# Patient Record
Sex: Male | Born: 1950 | Race: Black or African American | Hispanic: No | Smoking: Current some day smoker
Health system: Southern US, Community
[De-identification: ages and names within clinical notes are randomized; demographics above are authoritative.]

## PROBLEM LIST (undated history)

## (undated) DIAGNOSIS — I10 Essential (primary) hypertension: Secondary | ICD-10-CM

## (undated) DIAGNOSIS — K219 Gastro-esophageal reflux disease without esophagitis: Secondary | ICD-10-CM

## (undated) DIAGNOSIS — I82409 Acute embolism and thrombosis of unspecified deep veins of unspecified lower extremity: Secondary | ICD-10-CM

## (undated) HISTORY — DX: Acute embolism and thrombosis of unspecified deep veins of unspecified lower extremity: I82.409

## (undated) HISTORY — PX: ANKLE SURGERY: SHX546

## (undated) HISTORY — PX: PILONIDAL CYST EXCISION: SHX744

## (undated) HISTORY — PX: ESOPHAGEAL DILATION: SHX303

## (undated) HISTORY — DX: Essential (primary) hypertension: I10

---

## 2008-04-23 ENCOUNTER — Emergency Department (HOSPITAL_COMMUNITY): Admission: EM | Admit: 2008-04-23 | Discharge: 2008-04-24 | Payer: Self-pay | Admitting: Emergency Medicine

## 2008-04-27 ENCOUNTER — Ambulatory Visit (HOSPITAL_COMMUNITY): Admission: RE | Admit: 2008-04-27 | Discharge: 2008-04-27 | Payer: Self-pay | Admitting: Orthopedic Surgery

## 2011-02-05 NOTE — Op Note (Signed)
Kenneth Sims, Kenneth Sims           ACCOUNT NO.:  [id number]   MEDICAL RECORD NO.:  [id number]          PATIENT TYPE:  AMB   LOCATION:  SDS                          FACILITY:  [facility]   PHYSICIAN:  Vania Rea. Supple, M.D.  DATE OF BIRTH:  Aug 08, 1951   DATE OF PROCEDURE:  04/27/2008  DATE OF DISCHARGE:  04/27/2008                               OPERATIVE REPORT   PREOPERATIVE DIAGNOSIS:  Displaced left distal fibular fracture.   POSTOPERATIVE DIAGNOSIS:  Displaced left distal fibular fracture.   PROCEDURE:  Open reduction and internal fixation of displaced left  distal fibular fracture.   SURGEON:  Vania Rea. Supple, MD   ASSISTANT:  Lucita Lora. Shuford, PA-C   ANESTHESIA:  LMA general.   TOURNIQUET TIME:  Less than one hour.   ESTIMATED BLOOD LOSS:  Minimal.   DRAINS:  None.   HISTORY:  Mr. Kenneth Sims is a 60 year old gentleman who sustained a left  ankle fracture-dislocation this past weekend and underwent a successful  closed reduction and splinting in the emergency room.  On followup in  our office, we reviewed his radiographs.  He was in a well-padded splint  and was grossly neurovascularly intact.  Due to the degree of  displacement, we have discussed open reduction and internal fixation.  Possible surgical complication, bleeding, infection, neurovascular  injury, DVT, PE, malunion, nonunion, loss of fixation, and possible need  for additional surgery are reviewed.  He understands and accepts and  agrees with our planned procedure.   PROCEDURE IN DETAIL:  After undergoing routine preoperative evaluation,  the patient received prophylactic antibiotics.  Placed supine on the  operating table and underwent smooth induction of an LMA general  anesthesia.  Tourniquet applied to the left thigh.  Left leg was  sterilely prepped and draped in the standard fashion.  We did find two  fracture blisters medially.  These were protected.  Leg was  exsanguinated with the tourniquet inflated  to 350 mmHg.  We made a  longitudinal 12-cm incision over the lateral aspect of the leg distally  overlying the distal fibula.  Skin flaps were elevated anteriorly and  posteriorly and dissection carried deeply down to the lateral margin of  the fibula, and a subperiosteal dissection was used to expose the fibula  surrounding the fracture site as well as his posterolateral margin.  The  fracture site was exposed, irrigated, and interposed soft tissue and  hematoma was meticulously removed.  Under direct visualization, the  fibula was reduced.  We contoured a 10-hole locking one-third tubular  plate fitted on the posterolateral margin of the distal fibula and this  was then transfixed initially with a lag screw passing through the plate  and across the fracture site using standard technique and we placed  three locking screws distally.  A second lag screw obliquely across the  fracture site and then additional locking screws proximally as well as a  single cortical screw, which we placed primarily.  Good bony fixation  was achieved.  Intraoperative fluoroscopic images were then obtained,  which showed good alignment of the fracture site and good position of  the  hardware.  The wound was then copiously irrigated.  It was closed in  layers with 0 Vicryl for the deep fascia, 2-0 Vicryl for the subcu, and  intracuticular 3-0 Monocryl for the skin followed by Steri-Strips.  A  bulky dry dressing was then placed over the incision.  Once the closure  was completed, we did instill 0.5% Marcaine liberally along the skin  edges of the incision.  We then applied a very well-padded short-leg  plaster splint with the ankle in neutral position.  The tourniquet was  then let down.  The patient was extubated and taken to the recovery room  in stable condition.      Vania Rea. Supple, M.D.  Electronically Signed     KMS/MEDQ  D:  04/27/2008  T:  04/28/2008  Job:  [id number]

## 2011-05-31 ENCOUNTER — Emergency Department (HOSPITAL_COMMUNITY)
Admission: EM | Admit: 2011-05-31 | Discharge: 2011-05-31 | Disposition: A | Discharge status: Home / Self Care | Payer: 59 | Attending: Emergency Medicine | Admitting: Emergency Medicine

## 2011-05-31 DIAGNOSIS — Z79899 Other long term (current) drug therapy: Secondary | ICD-10-CM | POA: Insufficient documentation

## 2011-05-31 DIAGNOSIS — M79609 Pain in unspecified limb: Secondary | ICD-10-CM | POA: Insufficient documentation

## 2011-05-31 DIAGNOSIS — M7989 Other specified soft tissue disorders: Secondary | ICD-10-CM | POA: Insufficient documentation

## 2011-05-31 DIAGNOSIS — Z86718 Personal history of other venous thrombosis and embolism: Secondary | ICD-10-CM | POA: Insufficient documentation

## 2011-05-31 DIAGNOSIS — E785 Hyperlipidemia, unspecified: Secondary | ICD-10-CM | POA: Insufficient documentation

## 2011-05-31 DIAGNOSIS — I824Z9 Acute embolism and thrombosis of unspecified deep veins of unspecified distal lower extremity: Secondary | ICD-10-CM | POA: Insufficient documentation

## 2011-05-31 LAB — POCT I-STAT, CHEM 8
BUN: 20 mg/dL (ref 6–23)
Calcium, Ion: 1.18 mmol/L (ref 1.12–1.32)
Chloride: 98 mEq/L (ref 96–112)
Creatinine, Ser: 1.5 mg/dL — ABNORMAL HIGH (ref 0.50–1.35)

## 2011-05-31 LAB — PROTIME-INR
INR: 1.03 (ref 0.00–1.49)
Prothrombin Time: 13.7 seconds (ref 11.6–15.2)

## 2011-06-21 LAB — CBC
MCV: 91.7
RBC: 4.89
WBC: 7.1

## 2011-06-21 LAB — URINALYSIS, ROUTINE W REFLEX MICROSCOPIC
Glucose, UA: NEGATIVE
Ketones, ur: NEGATIVE
pH: 5.5

## 2011-06-21 LAB — COMPREHENSIVE METABOLIC PANEL
ALT: 23
AST: 40 — ABNORMAL HIGH
CO2: 27
Chloride: 102
GFR calc Af Amer: >60
GFR calc non Af Amer: >60
Sodium: 135
Total Bilirubin: 0.6

## 2011-07-04 ENCOUNTER — Other Ambulatory Visit: Payer: Self-pay | Admitting: Internal Medicine

## 2011-07-10 ENCOUNTER — Ambulatory Visit
Admission: RE | Admit: 2011-07-10 | Discharge: 2011-07-10 | Disposition: A | Discharge status: Home / Self Care | Payer: 59 | Source: Ambulatory Visit | Attending: Internal Medicine | Admitting: Internal Medicine

## 2013-07-02 ENCOUNTER — Ambulatory Visit (INDEPENDENT_AMBULATORY_CARE_PROVIDER_SITE_OTHER): Payer: 59

## 2013-07-02 DIAGNOSIS — Z23 Encounter for immunization: Secondary | ICD-10-CM

## 2017-01-17 ENCOUNTER — Encounter (HOSPITAL_COMMUNITY): Payer: Self-pay | Admitting: Emergency Medicine

## 2017-01-17 ENCOUNTER — Emergency Department (HOSPITAL_COMMUNITY)
Admission: EM | Admit: 2017-01-17 | Discharge: 2017-01-17 | Disposition: A | Discharge status: Home / Self Care | Payer: 59 | Attending: Emergency Medicine | Admitting: Emergency Medicine

## 2017-01-17 DIAGNOSIS — F17200 Nicotine dependence, unspecified, uncomplicated: Secondary | ICD-10-CM | POA: Insufficient documentation

## 2017-01-17 DIAGNOSIS — R1013 Epigastric pain: Secondary | ICD-10-CM | POA: Diagnosis present

## 2017-01-17 HISTORY — DX: Gastro-esophageal reflux disease without esophagitis: K21.9

## 2017-01-17 LAB — URINALYSIS, ROUTINE W REFLEX MICROSCOPIC
Bacteria, UA: NONE SEEN
Bilirubin Urine: NEGATIVE
GLUCOSE, UA: NEGATIVE mg/dL
Ketones, ur: NEGATIVE mg/dL
Leukocytes, UA: NEGATIVE
NITRITE: NEGATIVE
Protein, ur: NEGATIVE mg/dL
SPECIFIC GRAVITY, URINE: 1.023 (ref 1.005–1.030)
Squamous Epithelial / LPF: NONE SEEN
pH: 5 (ref 5.0–8.0)

## 2017-01-17 LAB — CBC
HCT: 44.4 % (ref 39.0–52.0)
HEMOGLOBIN: 15.3 g/dL (ref 13.0–17.0)
MCH: 30.4 pg (ref 26.0–34.0)
MCHC: 34.5 g/dL (ref 30.0–36.0)
MCV: 88.1 fL (ref 78.0–100.0)
Platelets: 255 10*3/uL (ref 150–400)
RBC: 5.04 MIL/uL (ref 4.22–5.81)
RDW: 13.3 % (ref 11.5–15.5)
WBC: 6.1 10*3/uL (ref 4.0–10.5)

## 2017-01-17 LAB — COMPREHENSIVE METABOLIC PANEL
ALBUMIN: 3.9 g/dL (ref 3.5–5.0)
ALK PHOS: 43 U/L (ref 38–126)
ALT: 17 U/L (ref 17–63)
ANION GAP: 7 (ref 5–15)
AST: 24 U/L (ref 15–41)
BILIRUBIN TOTAL: 0.6 mg/dL (ref 0.3–1.2)
BUN: 12 mg/dL (ref 6–20)
CALCIUM: 9.4 mg/dL (ref 8.9–10.3)
CO2: 26 mmol/L (ref 22–32)
Chloride: 103 mmol/L (ref 101–111)
Creatinine, Ser: 1.21 mg/dL (ref 0.61–1.24)
GFR calc Af Amer: >60 mL/min (ref >60)
GLUCOSE: 103 mg/dL — AB (ref 65–99)
Potassium: 3.9 mmol/L (ref 3.5–5.1)
Sodium: 136 mmol/L (ref 135–145)
TOTAL PROTEIN: 7.8 g/dL (ref 6.5–8.1)

## 2017-01-17 LAB — LIPASE, BLOOD: Lipase: 64 U/L — ABNORMAL HIGH (ref 11–51)

## 2017-01-17 MED ORDER — GI COCKTAIL ~~LOC~~
30.0000 mL | Freq: Once | ORAL | Status: AC
  Administered 2017-01-17: 30 mL via ORAL
  Filled 2017-01-17: qty 30

## 2017-01-17 MED ORDER — SUCRALFATE 1 G PO TABS
1.0000 g | ORAL_TABLET | Freq: Once | ORAL | Status: AC
  Administered 2017-01-17: 1 g via ORAL
  Filled 2017-01-17: qty 1

## 2017-01-17 MED ORDER — PANTOPRAZOLE SODIUM 20 MG PO TBEC: 20.0000 mg | DELAYED_RELEASE_TABLET | Freq: Every day | ORAL | 0 refills | Status: DC

## 2017-01-17 MED ORDER — SUCRALFATE 1 G PO TABS: 1.0000 g | ORAL_TABLET | Freq: Four times a day (QID) | ORAL | 0 refills | Status: DC

## 2017-01-17 NOTE — ED Triage Notes (Signed)
Pt c/o mid and upper abdominal pain and indigestion x 2 weeks. Pt states he has had hx of GERD and esophageal procedure and takes Zantac without relief. Pt with decreased appetite, c/o bloating and reports diarrhea and loose stool x 2 weeks.

## 2017-01-17 NOTE — ED Notes (Signed)
PT DISCHARGED. INSTRUCTIONS AND PRESCRIPTIONS GIVEN. AAOX4. PT IN NO APPARENT DISTRESS OR PAIN. THE OPPORTUNITY TO ASK QUESTIONS WAS PROVIDED. 

## 2017-01-17 NOTE — ED Provider Notes (Signed)
[facility]-EMERGENCY DEPT Provider Note   CSN: [id number] Arrival date & time: 01/17/17  1114     History   Chief Complaint Chief Complaint  Patient presents with  . Abdominal Pain    HPI Kenneth Sims is a 66 y.o. male.  66 year old male presents with epigastric discomfort rating to his chest 2 weeks. No associated anginal type symptoms such as shortness of breath or diaphoresis. He also has a history of esophageal strictures but has not been seen for this recently. Does note increased belching. Symptoms are improved with taking Zantac. No associated vomiting or diarrhea. Symptoms are somewhat worse with food. No exertional component to them.       Past Medical History:  Diagnosis Date  . GERD (gastroesophageal reflux disease)     There are no active problems to display for this patient.   Past Surgical History:  Procedure Laterality Date  . ESOPHAGEAL DILATION         Home Medications    Prior to Admission medications   Not on File    Family History History reviewed. No pertinent family history.  Social History Social History  Substance Use Topics  . Smoking status: Current Some Day Smoker  . Smokeless tobacco: Never Used  . Alcohol use No     Comment: weekend     Allergies   Patient has no allergy information on record.   Review of Systems Review of Systems  All other systems reviewed and are negative.    Physical Exam Updated Vital Signs BP 127/85 (BP Location: Left Arm)   Pulse 78   Temp 97.8 F (36.6 C) (Oral)   Resp 16   SpO2 100%   Physical Exam  Constitutional: He is oriented to person, place, and time. He appears well-developed and well-nourished.  Non-toxic appearance. No distress.  HENT:  Head: Normocephalic and atraumatic.  Eyes: Conjunctivae, EOM and lids are normal. Pupils are equal, round, and reactive to light.  Neck: Normal range of motion. Neck supple. No tracheal deviation present. No thyroid mass present.    Cardiovascular: Normal rate, regular rhythm and normal heart sounds.  Exam reveals no gallop.   No murmur heard. Pulmonary/Chest: Effort normal and breath sounds normal. No stridor. No respiratory distress. He has no decreased breath sounds. He has no wheezes. He has no rhonchi. He has no rales.  Abdominal: Soft. Normal appearance and bowel sounds are normal. He exhibits no distension. There is no tenderness. There is no rebound and no CVA tenderness.  Musculoskeletal: Normal range of motion. He exhibits no edema or tenderness.  Neurological: He is alert and oriented to person, place, and time. He has normal strength. No cranial nerve deficit or sensory deficit. GCS eye subscore is 4. GCS verbal subscore is 5. GCS motor subscore is 6.  Skin: Skin is warm and dry. No abrasion and no rash noted.  Psychiatric: He has a normal mood and affect. His speech is normal and behavior is normal.  Nursing note and vitals reviewed.    ED Treatments / Results  Labs (all labs ordered are listed, but only abnormal results are displayed) Labs Reviewed  CBC  LIPASE, BLOOD  COMPREHENSIVE METABOLIC PANEL  URINALYSIS, ROUTINE W REFLEX MICROSCOPIC    EKG  EKG Interpretation None       Radiology No results found.  Procedures Procedures (including critical care time)  Medications Ordered in ED Medications  sucralfate (CARAFATE) tablet 1 g (not administered)  gi cocktail (Maalox,Lidocaine,Donnatal) (not administered)  Initial Impression / Assessment and Plan / ED Course  I have reviewed the triage vital signs and the nursing notes.  Pertinent labs & imaging results that were available during my care of the patient were reviewed by me and considered in my medical decision making (see chart for details).     Patient treated for acid reflux and feels better. He also has history of esophageal strictures nothing that that plays a component to this. Will follow-up with his primary care doctor  in a week and will prescribe patient a PPI as well as Carafate  Final Clinical Impressions(s) / ED Diagnoses   Final diagnoses:  None    New Prescriptions New Prescriptions   No medications on file     Lorre Nick, MD 01/17/17 1348

## 2017-02-26 ENCOUNTER — Ambulatory Visit: Payer: 59 | Admitting: Gastroenterology

## 2017-03-14 ENCOUNTER — Ambulatory Visit: Payer: 59 | Admitting: Gastroenterology

## 2017-04-24 ENCOUNTER — Encounter: Payer: Self-pay | Admitting: Gastroenterology

## 2017-05-02 ENCOUNTER — Ambulatory Visit: Payer: 59 | Admitting: Gastroenterology

## 2017-06-27 ENCOUNTER — Ambulatory Visit: Payer: 59 | Admitting: Gastroenterology

## 2017-08-01 ENCOUNTER — Ambulatory Visit: Payer: 59 | Admitting: Gastroenterology

## 2017-09-09 ENCOUNTER — Encounter: Payer: Self-pay | Admitting: Gastroenterology

## 2017-10-31 ENCOUNTER — Telehealth: Payer: Self-pay

## 2017-10-31 ENCOUNTER — Ambulatory Visit: Payer: Managed Care, Other (non HMO) | Admitting: Gastroenterology

## 2017-10-31 ENCOUNTER — Encounter (INDEPENDENT_AMBULATORY_CARE_PROVIDER_SITE_OTHER): Payer: Self-pay

## 2017-10-31 ENCOUNTER — Encounter: Payer: Self-pay | Admitting: Gastroenterology

## 2017-10-31 VITALS — BP 118/66 | HR 88 | Ht 75.0 in | Wt 256.0 lb

## 2017-10-31 DIAGNOSIS — R1310 Dysphagia, unspecified: Secondary | ICD-10-CM | POA: Diagnosis not present

## 2017-10-31 NOTE — Telephone Encounter (Signed)
EGD scheduled for 12-02-2017. Xarelto clearance letter faxed to Dr Tenna DelaineBadgers office. Will await response.

## 2017-10-31 NOTE — Patient Instructions (Addendum)
If you are age 68 or older, your body mass index should be between 23-30. Your Body mass index is 32 kg/m. If this is out of the aforementioned range listed, please consider follow up with your Primary Care Provider.  If you are age 68 or younger, your body mass index should be between 19-25. Your Body mass index is 32 kg/m. If this is out of the aformentioned range listed, please consider follow up with your Primary Care Provider.   You have been scheduled for an endoscopy. Please follow written instructions given to you at your visit today. If you use inhalers (even only as needed), please bring them with you on the day of your procedure. Your physician has requested that you go to www.startemmi.com and enter the access code given to you at your visit today. This web site gives a general overview about your procedure. However, you should still follow specific instructions given to you by our office regarding your preparation for the procedure.  You will be contaced by our office prior to your procedure for directions on holding your xarelto.  If you do not hear from our office 1 week prior to your scheduled procedure, please call [phone] to discuss.  Thank you for choosing [facility]  Dr Amada JupiterHenry Danis III

## 2017-10-31 NOTE — Progress Notes (Signed)
[facility] Consult Note:  History: Kenneth Sims 10/31/2017  Referring physician: Eartha Inch, MD  Reason for consult/chief complaint: Dysphagia (has been occuring for years; typically to solid foods but occasionally to liquids as well;  ); Emesis (happens episodically); and Gastroesophageal Reflux (has reflux as well as epigastric abdominal discomfort at times)   Subjective  HPI:  This is a 67 year old man referred by primary care noted above for chronic dysphagia.  He reports that it has been occurring for decades, and he believes he had an upper endoscopy with a dilation decades ago.  He will often feel that solid food or liquids feel "heavy" or stuck in the upper abdomen.  It does not seem to feel stuck in the chest.  Undigested food will often come back up,  it sounds more likely regurgitation and then vomiting.  He denies odynophagia, altered bowel habits or rectal bleeding.  His appetite is good and weight stable.  He reports a normal colonoscopy with the last few years, perhaps done in [place].  He also had an emergency department visit at [facility] last April for GERD.  Since he only occasionally gets heartburn, he takes Zantac as needed.  ROS:  Review of Systems  Constitutional: Negative for appetite change and unexpected weight change.  HENT: Negative for mouth sores and voice change.   Eyes: Negative for pain and redness.  Respiratory: Negative for cough and shortness of breath.   Cardiovascular: Negative for chest pain and palpitations.  Genitourinary: Negative for dysuria and hematuria.  Musculoskeletal: Negative for arthralgias and myalgias.  Skin: Negative for pallor and rash.  Neurological: Negative for weakness and headaches.  Hematological: Negative for adenopathy.     Past Medical History: Past Medical History:  Diagnosis Date  . DVT (deep venous thrombosis) (HCC)   . GERD (gastroesophageal reflux disease)   .  Hypertension    DVT was many years ago, but he is maintained on Xarelto by Dr. Cyndia Bent.  Past Surgical History: Past Surgical History:  Procedure Laterality Date  . ANKLE SURGERY Left   . ESOPHAGEAL DILATION    . PILONIDAL CYST EXCISION     at rectum     Family History: Family History  Problem Relation Age of Onset  . Esophageal cancer Neg Hx   . Colon cancer Neg Hx   . Pancreatic cancer Neg Hx   . Stomach cancer Neg Hx   . Liver disease Neg Hx     Social History: Social History   Socioeconomic History  . Marital status: Widowed    Spouse name: None  . Number of children: None  . Years of education: None  . Highest education level: None  Social Needs  . Financial resource strain: None  . Food insecurity - worry: None  . Food insecurity - inability: None  . Transportation needs - medical: None  . Transportation needs - non-medical: None  Occupational History  . None  Tobacco Use  . Smoking status: Current Some Day Smoker  . Smokeless tobacco: Never Used  Substance and Sexual Activity  . Alcohol use: No    Comment: weekend  . Drug use: Yes    Types: Marijuana  . Sexual activity: None  Other Topics Concern  . None  Social History Narrative  . None  Occasionally smokes cigars and marijuana  Allergies: No Known Allergies  Outpatient Meds: Current Outpatient Medications  Medication Sig Dispense Refill  . atorvastatin (LIPITOR) 40 MG tablet Take 40 mg by  mouth daily.    Marland Kitchen. lisinopril (PRINIVIL,ZESTRIL) 40 MG tablet Take 40 mg by mouth daily.    . pantoprazole (PROTONIX) 20 MG tablet Take 1 tablet (20 mg total) by mouth daily. (Patient taking differently: Take 20 mg by mouth daily as needed. ) 60 tablet 0  . rivaroxaban (XARELTO) 20 MG TABS tablet Take 20 mg by mouth daily.    . sucralfate (CARAFATE) 1 g tablet Take 1 tablet (1 g total) by mouth 4 (four) times daily. 30 tablet 0   No current facility-administered medications for this visit.        ___________________________________________________________________ Objective   Exam:  BP 118/66   Pulse 88   Ht 6\' 3"  (1.905 m)   Wt 256 lb (116.1 kg)   BMI 32.00 kg/m    General: this is a(n) well-appearing man, pleasant but poor eye contact  Eyes: sclera anicteric, no redness  ENT: oral mucosa moist without lesions, no cervical or supraclavicular lymphadenopathy, good dentition  CV: RRR without murmur, S1/S2, no JVD, no peripheral edema  Resp: clear to auscultation bilaterally, normal RR and effort noted  GI: soft, no tenderness, with active bowel sounds. No guarding or palpable organomegaly noted.  Skin; warm and dry, no rash or jaundice noted  Neuro: awake, alert and oriented x 3. Normal gross motor function and fluent speech  No data for review  Assessment: Encounter Diagnosis  Name Primary?  Marland Kitchen. Dysphagia, unspecified type Yes    It is difficult to tell for certain if this is true dysphagia or severe regurgitation.  Consider esophageal stricture, achalasia, gastric outlet obstruction.  It occurs intermittently, and has been going on for decades, previous endoscopic findings are known.  Plan:  EGD with possible dilation.  He is agreeable after thorough discussion of procedure and risks.  He needs to be off Xarelto 2 days prior, he understands the risk of DVT/PE while he is off the medicine, which would be 2 days prior and perhaps up to a day afterwards of a dilation is performed.  He says this will probably not be an issue since sometimes he does not take the medicine for a week at a time.  Nevertheless, we will clear it with his primary care doctor who prescribed this medicine.   The benefits and risks of the planned procedure were described in detail with the patient or (when appropriate) their health care proxy.  Risks were outlined as including, but not limited to, bleeding, infection, perforation, adverse medication reaction leading to cardiac or  pulmonary decompensation, or pancreatitis (if ERCP).  The limitation of incomplete mucosal visualization was also discussed.  No guarantees or warranties were given.   Thank you for the courtesy of this consult.  Please call me with any questions or concerns.  Kenneth Sims  CC: Eartha InchBadger, Michael C, MD

## 2017-11-12 NOTE — Telephone Encounter (Signed)
2nd request faxed.

## 2017-11-17 NOTE — Telephone Encounter (Signed)
Left a message with Sabrina at Dr. Tenna DelaineBadgers office. She will have the MA return my call Re: holding Xarelto

## 2017-11-19 NOTE — Telephone Encounter (Signed)
See scanned letter returned form Dr Tenna DelaineBadgers office. Okay to hold Xarelto 12-01-17 and 12-02-2017 (48 hours prior to procedure).

## 2017-11-19 NOTE — Telephone Encounter (Signed)
Left message for pt to return call.

## 2017-11-24 NOTE — Telephone Encounter (Signed)
Left a message to return call.  

## 2017-12-02 ENCOUNTER — Encounter: Payer: Self-pay | Admitting: Gastroenterology

## 2017-12-02 ENCOUNTER — Telehealth: Payer: Self-pay | Admitting: Gastroenterology

## 2017-12-02 ENCOUNTER — Other Ambulatory Visit (HOSPITAL_COMMUNITY)
Admission: RE | Admit: 2017-12-02 | Discharge: 2017-12-02 | Disposition: A | Discharge status: Home / Self Care | Payer: Managed Care, Other (non HMO) | Source: Ambulatory Visit | Attending: Gastroenterology | Admitting: Gastroenterology

## 2017-12-02 ENCOUNTER — Other Ambulatory Visit: Payer: Self-pay

## 2017-12-02 ENCOUNTER — Ambulatory Visit (AMBULATORY_SURGERY_CENTER): Payer: Managed Care, Other (non HMO) | Admitting: Gastroenterology

## 2017-12-02 VITALS — BP 130/83 | HR 58 | Temp 98.2°F | Resp 14 | Ht 75.0 in | Wt 256.0 lb

## 2017-12-02 DIAGNOSIS — R1110 Vomiting, unspecified: Secondary | ICD-10-CM

## 2017-12-02 DIAGNOSIS — R1310 Dysphagia, unspecified: Secondary | ICD-10-CM

## 2017-12-02 MED ORDER — SODIUM CHLORIDE 0.9 % IV SOLN: 500.0000 mL | Freq: Once | INTRAVENOUS | Status: DC

## 2017-12-02 NOTE — Progress Notes (Signed)
Pt's states no medical or surgical changes since previsit or office visit. 

## 2017-12-02 NOTE — Telephone Encounter (Signed)
Please schedule the following tests for this patient:  Barium swallow  Esophageal manometry (does not need to hold Xarelto prior);  Indication:  Dysphagia, suspected achalasia  Also give him an appointment 2-3 weeks after the manometry is done to allow time for it to be read.  Mr. Kenneth Sims is expecting to hear from you.

## 2017-12-02 NOTE — Progress Notes (Signed)
Called to room to assist during endoscopic procedure.  Patient ID and intended procedure confirmed with present staff. Received instructions for my participation in the procedure from the performing physician.  

## 2017-12-02 NOTE — Patient Instructions (Signed)
RESUME XARELTO today.  YOU HAD AN ENDOSCOPIC PROCEDURE TODAY AT THE [facility]:   Refer to the procedure report that was given to you for any specific questions about what was found during the examination.  If the procedure report does not answer your questions, please call your gastroenterologist to clarify.  If you requested that your care partner not be given the details of your procedure findings, then the procedure report has been included in a sealed envelope for you to review at your convenience later.  YOU SHOULD EXPECT: Some feelings of bloating in the abdomen. Passage of more gas than usual.  Walking can help get rid of the air that was put into your GI tract during the procedure and reduce the bloating. If you had a lower endoscopy (such as a colonoscopy or flexible sigmoidoscopy) you may notice spotting of blood in your stool or on the toilet paper. If you underwent a bowel prep for your procedure, you may not have a normal bowel movement for a few days.  Please Note:  You might notice some irritation and congestion in your nose or some drainage.  This is from the oxygen used during your procedure.  There is no need for concern and it should clear up in a day or so.  SYMPTOMS TO REPORT IMMEDIATELY:    Following upper endoscopy (EGD)  Vomiting of blood or coffee ground material  New chest pain or pain under the shoulder blades  Painful or persistently difficult swallowing  New shortness of breath  Fever of 100F or higher  Black, tarry-looking stools  For urgent or emergent issues, a gastroenterologist can be reached at any hour by calling [phone].   DIET:  We do recommend a small meal at first, but then you may proceed to your regular diet.  Drink plenty of fluids but you should avoid alcoholic beverages for 24 hours.  ACTIVITY:  You should plan to take it easy for the rest of today and you should NOT DRIVE or use heavy machinery until tomorrow (because of  the sedation medicines used during the test).    FOLLOW UP: Our staff will call the number listed on your records the next business day following your procedure to check on you and address any questions or concerns that you may have regarding the information given to you following your procedure. If we do not reach you, we will leave a message.  However, if you are feeling well and you are not experiencing any problems, there is no need to return our call.  We will assume that you have returned to your regular daily activities without incident.  If any biopsies were taken you will be contacted by phone or by letter within the next 1-3 weeks.  Please call us at [phone] if you have not heard about the biopsies in 3 weeks.    SIGNATURES/CONFIDENTIALITY: You and/or your care partner have signed paperwork which will be entered into your electronic medical record.  These signatures attest to the fact that that the information above on your After Visit Summary has been reviewed and is understood.  Full responsibility of the confidentiality of this discharge information lies with you and/or your care-partner.

## 2017-12-02 NOTE — Progress Notes (Signed)
[facility]brushing[facility] and handed it to BJ'sDebbie Gibson, lab employee.

## 2017-12-02 NOTE — Op Note (Signed)
[facility] Patient Name: Kenneth RochesterKenneth Sims Procedure Date: 12/02/2017 10:31 AM MRN: [id number] Endoscopist: Sherilyn CooterHenry L. Myrtie Neitheranis , MD Age: 67 Referring MD:  Date of Birth: 05/14/1951 Gender: Male Account #: [id number] Procedure:                Upper GI endoscopy Indications:              Dysphagia, Suspected esophageal reflux, Vomiting Medicines:                Monitored Anesthesia Care Procedure:                Pre-Anesthesia Assessment:                           - Prior to the procedure, a History and Physical                            was performed, and patient medications and                            allergies were reviewed. The patient's tolerance of                            previous anesthesia was also reviewed. The risks                            and benefits of the procedure and the sedation                            options and risks were discussed with the patient.                            All questions were answered, and informed consent                            was obtained. Prior Anticoagulants: The patient has                            taken Xarelto (rivaroxaban), last dose was 8 days                            prior to procedure (though the patient had been                            instructed to hold for only 2 days prior). ASA                            Grade Assessment: II - A patient with mild systemic                            disease. After reviewing the risks and benefits,                            the patient was deemed in satisfactory condition to  undergo the procedure.                           After obtaining informed consent, the endoscope was                            passed under direct vision. Throughout the                            procedure, the patient's blood pressure, pulse, and                            oxygen saturations were monitored continuously. The                            Endoscope was  introduced through the mouth, and                            advanced to the second part of duodenum. The upper                            GI endoscopy was accomplished without difficulty.                            The patient tolerated the procedure well. Scope In: Scope Out: Findings:                 The larynx was normal.                           Esophagitis with no bleeding was found in the                            entire esophagus. Brushings were obtained in the                            middle third of the esophagus.                           The lumen of the esophagus was dilated, and the                            distal esophagus was tortuous.                           Abnormal motility was noted in the esophagus. There                            is a decrease in motility of the esophageal body                            (none was seen). The distal esophagus/lower                            esophageal sphincter is open, and the  scope passes                            without resistance. Normal peristalsis not noted.                           Food debris and liquid was found in the lower third                            of the esophagus.                           A small amount of food (residue) was found in the                            gastric body.                           The exam of the stomach was otherwise normal.                           The cardia and gastric fundus were normal on                            retroflexion.                           The examined duodenum was normal. Complications:            No immediate complications. Estimated Blood Loss:     Estimated blood loss: none. Impression:               - Normal larynx.                           - Candidiasis esophagitis. Brushings performed.                           - Dilation in the entire esophagus.                           - Abnormal esophageal motility, suspicious for                             achalasia.                           - Food in the lower third of the esophagus.                           - A small amount of food (residue) in the stomach.                           - Normal examined duodenum. Recommendation:           - Patient has a contact number available for  emergencies. The signs and symptoms of potential                            delayed complications were discussed with the                            patient. Return to normal activities tomorrow.                            Written discharge instructions were provided to the                            patient.                           - Low fiber diet.                           - Resume Xarelto (rivaroxaban) at prior dose today.                           - Await pathology results.                           - Perform routine esophageal manometry at                            appointment to be scheduled. Yaakov Saindon L. Myrtie Neither, MD 12/02/2017 10:53:05 AM This report has been signed electronically.

## 2017-12-02 NOTE — Progress Notes (Signed)
Spontaneous respirations throughout. VSS. Resting comfortably. To PACU on room air. Report to  RN. 

## 2017-12-03 ENCOUNTER — Telehealth: Payer: Self-pay | Admitting: *Deleted

## 2017-12-03 ENCOUNTER — Other Ambulatory Visit: Payer: Self-pay

## 2017-12-03 ENCOUNTER — Telehealth: Payer: Self-pay

## 2017-12-03 DIAGNOSIS — R1310 Dysphagia, unspecified: Secondary | ICD-10-CM

## 2017-12-03 NOTE — Telephone Encounter (Signed)
  Follow up Call-  Call back number 12/02/2017  Post procedure Call Back phone  # [phone]  Permission to leave phone message Yes  Some recent data might be hidden     Patient questions:  Do you have a fever, pain , or abdominal swelling? No. Pain Score  0 *  Have you tolerated food without any problems? Yes.    Have you been able to return to your normal activities? Yes.    Do you have any questions about your discharge instructions: Diet   No. Medications  No. Follow up visit  No.  Do you have questions or concerns about your Care? No.  Actions: * If pain score is 4 or above: No action needed, pain <4.

## 2017-12-03 NOTE — Telephone Encounter (Signed)
Left message

## 2017-12-03 NOTE — Telephone Encounter (Signed)
Patient [facility], [facility] and follow up office visit on 4/29 at 04:00. Left message for patient to call back, have also mailed appointment information and prep instructions to patient.

## 2017-12-05 ENCOUNTER — Other Ambulatory Visit: Payer: Self-pay

## 2017-12-05 ENCOUNTER — Telehealth: Payer: Self-pay

## 2017-12-05 MED ORDER — FLUCONAZOLE 100 MG PO TABS: 100.0000 mg | ORAL_TABLET | Freq: Every day | ORAL | 0 refills | Status: DC

## 2017-12-05 NOTE — Telephone Encounter (Signed)
Spoke to patient he is aware of all 3 appointments, will look for information in the mail with dates/times/prep instructions. Asked him to call back if he does not get it by 3/20 and will reprint a copy for him to pick up at our front desk.

## 2017-12-08 ENCOUNTER — Telehealth: Payer: Self-pay

## 2017-12-08 NOTE — Telephone Encounter (Signed)
Spoke to patient advised to not take his cholesterol medication, Lipitor, while on fluconazole.

## 2017-12-16 ENCOUNTER — Ambulatory Visit (HOSPITAL_COMMUNITY)
Admission: RE | Admit: 2017-12-16 | Discharge: 2017-12-16 | Disposition: A | Discharge status: Home / Self Care | Payer: Medicare Other | Source: Ambulatory Visit | Attending: Gastroenterology | Admitting: Gastroenterology

## 2017-12-16 DIAGNOSIS — R1310 Dysphagia, unspecified: Secondary | ICD-10-CM | POA: Diagnosis not present

## 2017-12-17 ENCOUNTER — Telehealth: Payer: Self-pay | Admitting: Gastroenterology

## 2017-12-17 NOTE — Telephone Encounter (Signed)
Patient fiance states they need to reschedule procedure at hosp on 4.3.19 with Dr.Nandigam.

## 2017-12-17 NOTE — Telephone Encounter (Signed)
Left message for Kenneth Sims, I have rescheduled his appointments and will resend information.

## 2017-12-18 NOTE — Telephone Encounter (Signed)
Spoke to patient let him know rescheduled dates for EM and OV, let him know he should receive a letter in the mail with all this information.

## 2017-12-18 NOTE — Telephone Encounter (Signed)
Pt said he is returning your call 

## 2018-01-19 ENCOUNTER — Ambulatory Visit: Payer: Managed Care, Other (non HMO) | Admitting: Gastroenterology

## 2018-01-26 ENCOUNTER — Ambulatory Visit (HOSPITAL_COMMUNITY)
Admission: RE | Admit: 2018-01-26 | Discharge: 2018-01-26 | Disposition: A | Discharge status: Home / Self Care | Payer: Managed Care, Other (non HMO) | Source: Ambulatory Visit | Attending: Gastroenterology | Admitting: Gastroenterology

## 2018-01-26 ENCOUNTER — Encounter (HOSPITAL_COMMUNITY)
Admission: RE | Disposition: A | Discharge status: Home / Self Care | Payer: Self-pay | Source: Ambulatory Visit | Attending: Gastroenterology

## 2018-01-26 DIAGNOSIS — K220 Achalasia of cardia: Secondary | ICD-10-CM | POA: Insufficient documentation

## 2018-01-26 DIAGNOSIS — R1310 Dysphagia, unspecified: Secondary | ICD-10-CM | POA: Diagnosis present

## 2018-01-26 DIAGNOSIS — R1319 Other dysphagia: Secondary | ICD-10-CM

## 2018-01-26 HISTORY — PX: ESOPHAGEAL MANOMETRY: SHX5429

## 2018-01-26 SURGERY — MANOMETRY, ESOPHAGUS
Anesthesia: Topical

## 2018-01-26 MED ORDER — LIDOCAINE VISCOUS 2 % MT SOLN
OROMUCOSAL | Status: AC
  Filled 2018-01-26: qty 15

## 2018-01-26 SURGICAL SUPPLY — 2 items
FACESHIELD LNG OPTICON STERILE (SAFETY) IMPLANT
GLOVE BIO SURGEON STRL SZ8 (GLOVE) ×6 IMPLANT

## 2018-01-26 NOTE — Progress Notes (Signed)
Esophageal Manometry done per protocol.  Patient tolerated well.  Report to be sent to Dr Kavitha Nandigam. 

## 2018-01-29 ENCOUNTER — Encounter (HOSPITAL_COMMUNITY): Payer: Self-pay | Admitting: Gastroenterology

## 2018-02-03 DIAGNOSIS — R1310 Dysphagia, unspecified: Secondary | ICD-10-CM

## 2018-02-03 DIAGNOSIS — R1319 Other dysphagia: Secondary | ICD-10-CM

## 2018-02-19 ENCOUNTER — Ambulatory Visit: Payer: Managed Care, Other (non HMO) | Admitting: Gastroenterology

## 2018-03-27 ENCOUNTER — Ambulatory Visit: Payer: Managed Care, Other (non HMO) | Admitting: Gastroenterology

## 2018-05-08 ENCOUNTER — Encounter: Payer: Self-pay | Admitting: Gastroenterology

## 2018-05-08 ENCOUNTER — Other Ambulatory Visit: Payer: Managed Care, Other (non HMO)

## 2018-05-08 ENCOUNTER — Ambulatory Visit: Payer: Managed Care, Other (non HMO) | Admitting: Gastroenterology

## 2018-05-08 VITALS — BP 166/104 | HR 74 | Ht 75.0 in | Wt 253.4 lb

## 2018-05-08 DIAGNOSIS — R1310 Dysphagia, unspecified: Secondary | ICD-10-CM | POA: Diagnosis not present

## 2018-05-08 DIAGNOSIS — R1319 Other dysphagia: Secondary | ICD-10-CM

## 2018-05-08 DIAGNOSIS — K220 Achalasia of cardia: Secondary | ICD-10-CM

## 2018-05-08 NOTE — Patient Instructions (Signed)
If you are age 68 or older, your body mass index should be between 23-30. Your Body mass index is 31.67 kg/m. If this is out of the aforementioned range listed, please consider follow up with your Primary Care Provider.  If you are age 48 or younger, your body mass index should be between 19-25. Your Body mass index is 31.67 kg/m. If this is out of the aformentioned range listed, please consider follow up with your Primary Care Provider.   Your provider has requested that you go to the basement level for lab work before leaving today. Press "B" on the elevator. The lab is located at the first door on the left as you exit the elevator.  We will send your records to [facility]. Please call us if you don't hear from them in the next 2 weeks  It was a pleasure to see you today!  Dr. Cheral Bay have been scheduled for a CT scan of the abdomen and pelvis at [place] ([address]---this is in the same building as Press photographer).   You are scheduled on 05-18-2018 at 1pm. You should arrive 15 minutes prior to your appointment time for registration. Please follow the written instructions below on the day of your exam:  WARNING: IF YOU ARE ALLERGIC TO IODINE/X-RAY DYE, PLEASE NOTIFY RADIOLOGY IMMEDIATELY AT [phone]! YOU WILL BE GIVEN A 13 HOUR PREMEDICATION PREP.  1) Do not eat or drink anything after 9am (4 hours prior to your test) 2) You have been given 2 bottles of oral contrast to drink. The solution may taste better if refrigerated, but do NOT add ice or any other liquid to this solution. Shake well before drinking.      Drink 1 bottle of contrast @ 12/noon (1 hour prior to your exam)  You may take any medications as prescribed with a small amount of water except for the following: Metformin, Glucophage, Glucovance, Avandamet, Riomet, Fortamet, Actoplus Met, Janumet, Glumetza or Metaglip. The above medications must be held the day of the exam AND 48  hours after the exam.  The purpose of you drinking the oral contrast is to aid in the visualization of your intestinal tract. The contrast solution may cause some diarrhea. Before your exam is started, you will be given a small amount of fluid to drink. Depending on your individual set of symptoms, you may also receive an intravenous injection of x-ray contrast/dye. Plan on being at [facility] for 30 minutes or longer, depending on the type of exam you are having performed.  This test typically takes 30-45 minutes to complete.  If you have any questions regarding your exam or if you need to reschedule, you may call the CT department at [phone] between the hours of 8:00 am and 5:00 pm, Monday-Friday.  ________________________________________________________________________

## 2018-05-08 NOTE — Progress Notes (Signed)
     [facility] Progress Note  Chief Complaint: Dysphagia  Subjective  History:  This is a 67 year old man I saw in February of this year for years of chronic dysphagia.  Upper endoscopy in March showed findings suggestive of achalasia as well as Candida infection confirmed on brushings.  He was treated with fluconazole and sent for a barium swallow, which also suggested achalasia.  He underwent esophageal manometry in May which confirmed type II achalasia.  We then had difficulty getting him in for follow-up, he and his fiance say they have been tied up with multiple issues. He has intermittent solid food dysphagia as before, sometimes daily for several days, sometimes just once a week.  He will occasionally have to bring food back up.  He denies weight loss or abdominal pain or chest pain. Decades of dysphagia and regurgitation   ROS: Cardiovascular:  no chest pain Respiratory: no dyspnea Or systems negative except as above The patient's Past Medical, Family and Social History were reviewed and are on file in the EMR.  Objective:  He takes no medicines  Vital signs in last 24 hrs: Vitals:   05/08/18 1545  BP: (!) 166/104  Pulse: 74   Physical Exam His fiance was present for the entire encounter He is well-appearing  HEENT: sclera anicteric, oral mucosa moist without lesions  Neck: supple, no thyromegaly, JVD or lymphadenopathy  Cardiac: RRR without murmurs, S1S2 heard, no peripheral edema  Pulm: clear to auscultation bilaterally, normal RR and effort noted  Abdomen: soft, no tenderness, with active bowel sounds. No guarding or palpable hepatosplenomegaly.  Skin; warm and dry, no jaundice or rash  Recent Labs:  Esophageal brushings, barium swallow and esophageal manometry reports as noted above   @ASSESSMENTPLANBEGIN @ Assessment: Encounter Diagnoses  Name Primary?  . Achalasia Yes  . Esophageal dysphagia    I explained achalasia and the possible  treatments including Botox and 30 mm balloon dilation and surgical myotomy.  I feel that given his good health, surgical therapy would be his best option durable solution to this.  If a fundoplication is to be performed with a, it should be a loose wrap due to the long-standing nature of this, aperistalsis seen on manometry and esophageal dilatation.   Plan:  CT scan chest and abdomen to rule out a mass or other space-occupying/constricting lesion that could be causing secondary achalasia. Surgical consultation for Heller myotomy/fundoplication  Total time 25 minutes, over half spent face-to-face with patient in counseling and coordination of care.   Charlie PitterHenry L Danis III

## 2018-05-09 LAB — BUN/CREATININE RATIO
BUN/Creatinine Ratio: 12 (calc) (ref 6–22)
BUN: 16 mg/dL (ref 7–25)
CREATININE: 1.3 mg/dL — AB (ref 0.70–1.25)
GFR, Est African American: 65 mL/min/{1.73_m2} (ref >=60)
GFR, Est Non African American: 56 mL/min/{1.73_m2} — ABNORMAL LOW (ref >=60)

## 2018-05-18 ENCOUNTER — Other Ambulatory Visit: Payer: Managed Care, Other (non HMO)

## 2018-05-18 ENCOUNTER — Inpatient Hospital Stay: Admission: RE | Admit: 2018-05-18 | Payer: Managed Care, Other (non HMO) | Source: Ambulatory Visit

## 2018-05-29 ENCOUNTER — Inpatient Hospital Stay: Admission: RE | Admit: 2018-05-29 | Payer: Managed Care, Other (non HMO) | Source: Ambulatory Visit

## 2018-06-03 ENCOUNTER — Telehealth: Payer: Self-pay

## 2018-06-03 NOTE — Telephone Encounter (Signed)
Records sent to [facility] for appointment on 05-14-2018. Dx Achalasia. Pt has been scheduled for 06-17-2018 with Dr Daphine Deutscher.

## 2018-07-19 IMAGING — RF DG ESOPHAGUS
10 of 15 series · 13 of 24 positions shown · non-contrast
Comparison: 07/10/2011 esophagram.

CLINICAL DATA: 67-year-old male with difficulty swallowing.
Suspicion for achalasia. Initial encounter.

EXAM:
ESOPHOGRAM / BARIUM SWALLOW / BARIUM TABLET STUDY
TECHNIQUE: Combined double contrast and single contrast examination performed
using effervescent crystals, thick barium liquid, and thin barium
liquid. The patient was observed with fluoroscopy swallowing a 13 mm
barium sulphate tablet.
FLUOROSCOPY TIME:  Fluoroscopy Time:  1 minutes and 12 seconds.
Radiation Exposure Index: 16.7 mGy

[Series 1: cp_standard · 0.54mm/px · 2 of 58 frames shown (1 of 10)]
[frame 8/58]
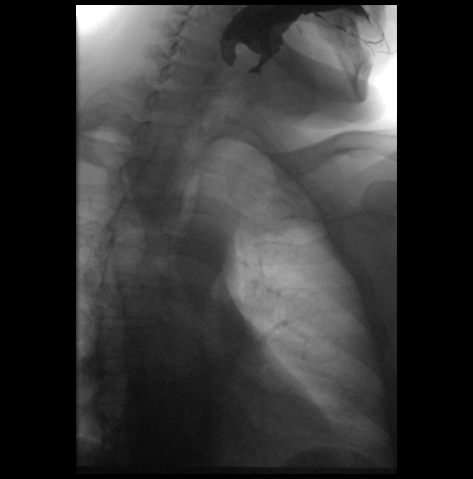
[frame 50/58]
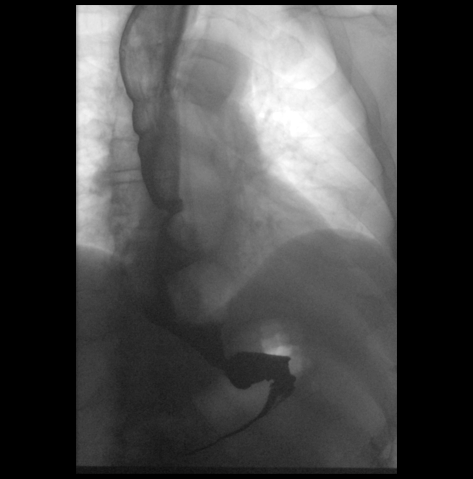

[Series 4: cp_standard · 0.27mm/px · 1 of 1 slices shown (2 of 10)]
[im 1/1]
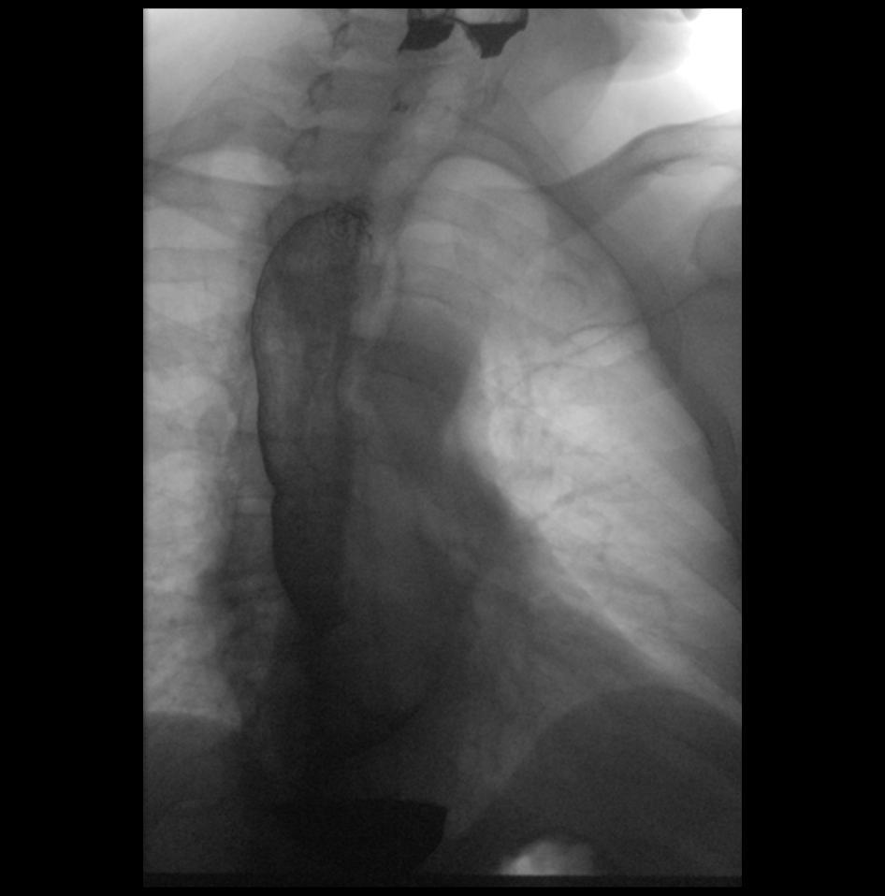

[Series 7: cp_standard · 0.54mm/px · 1 of 28 frames shown (3 of 10)]
[frame 15/28]
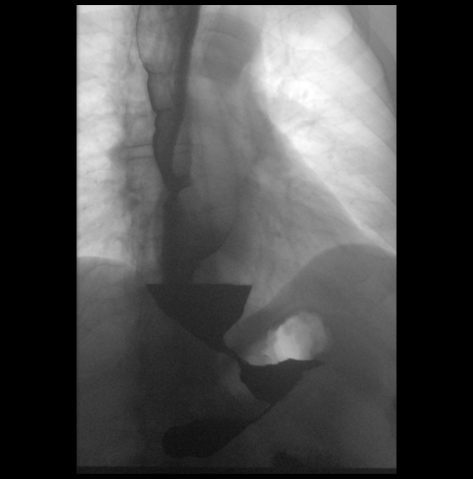

[Series 8: cp_standard · 0.54mm/px · 2 of 18 frames shown (4 of 10)]
[frame 3/18]
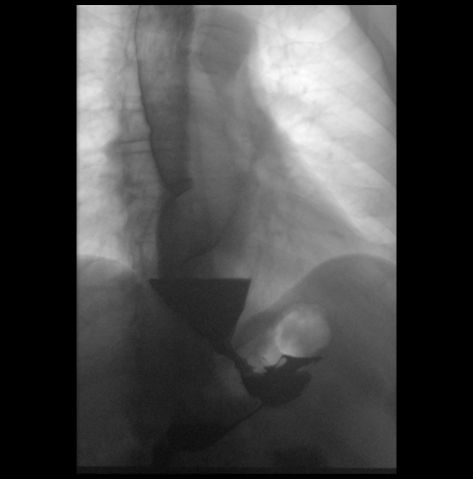
[frame 16/18]
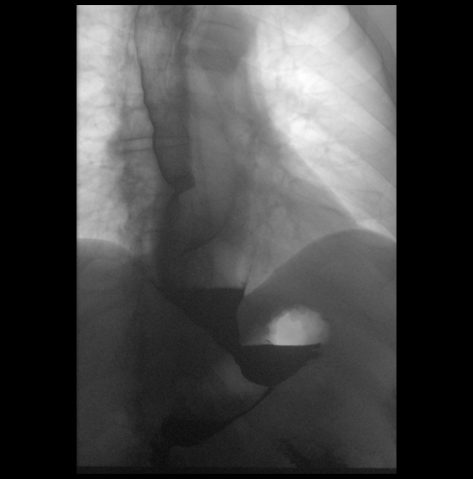

[Series 9: cp_standard · 0.52mm/px · 1 of 84 frames shown (5 of 10)]
[frame 43/84]
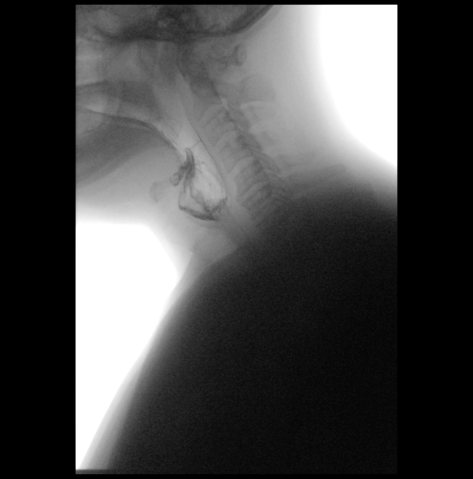

[Series 10: cp_standard · 0.52mm/px · 2 of 73 frames shown (6 of 10)]
[frame 1/73]
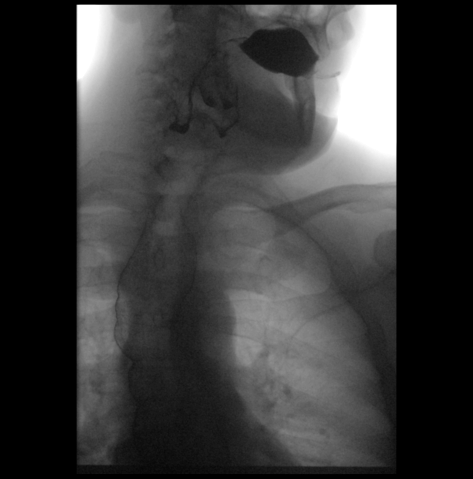
[frame 63/73]
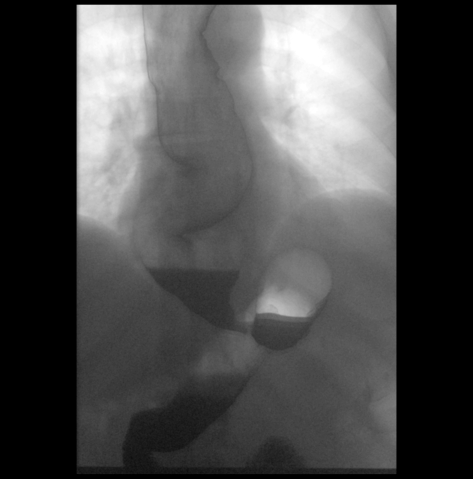

[Series 13: cp_standard · 0.27mm/px · 1 of 1 slices shown (7 of 10)]
[im 1/1]
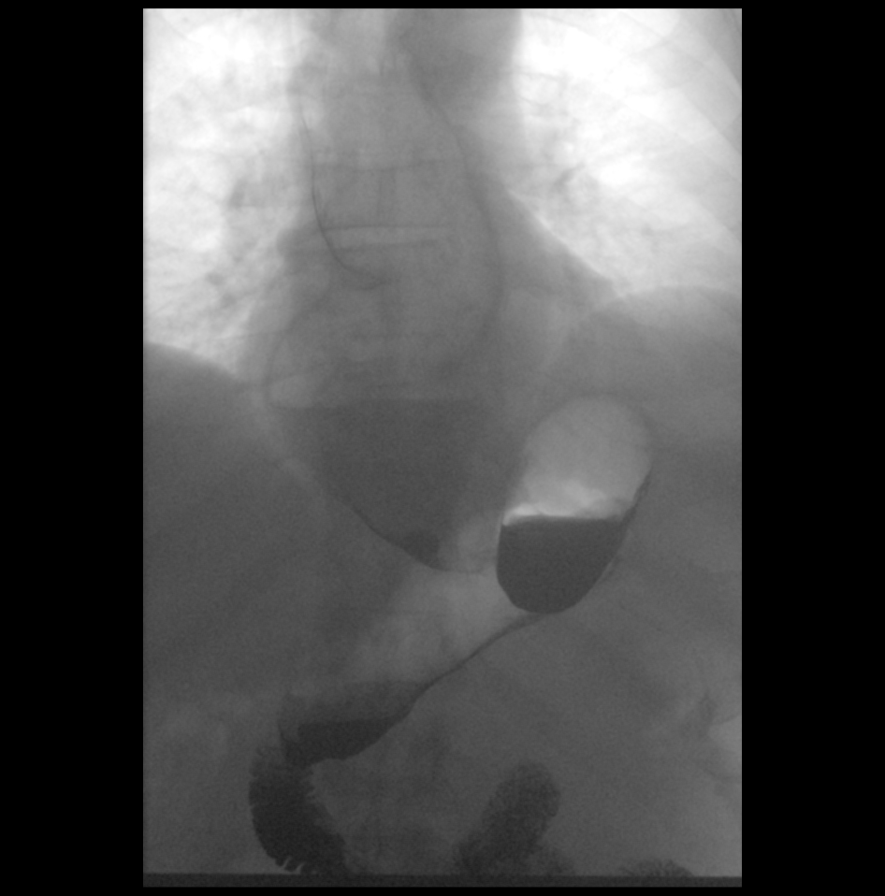

[Series 17: cp_standard · 0.27mm/px · 1 of 1 slices shown (8 of 10)]
[im 1/1]
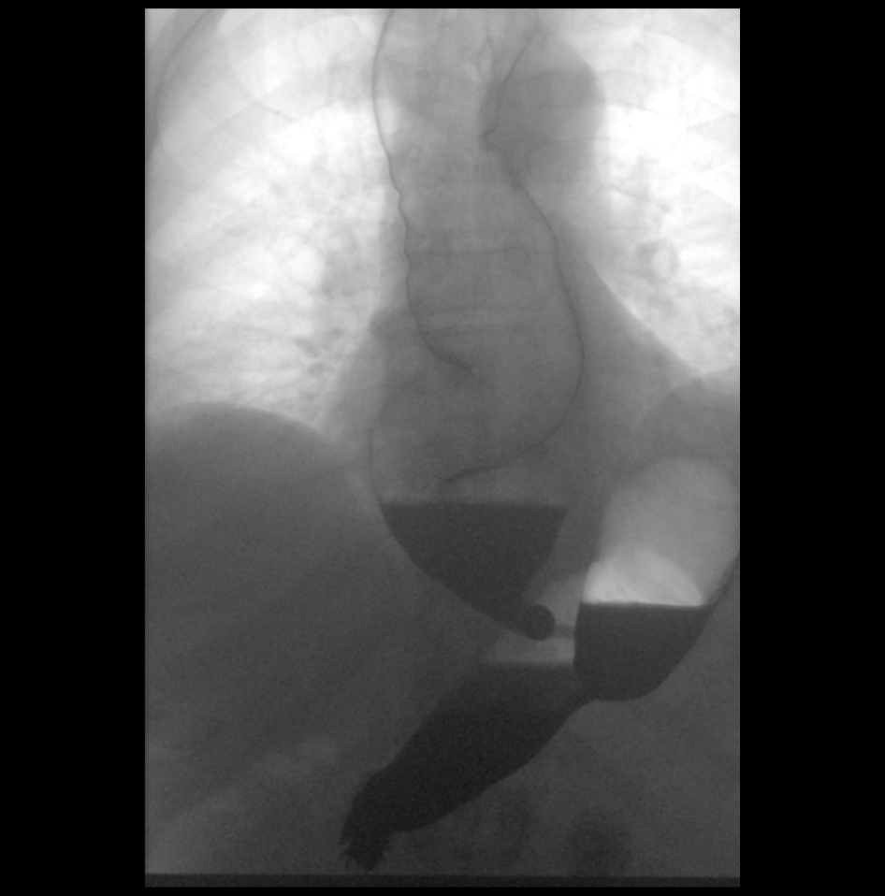

[Series 19: cp_standard · 0.55mm/px · 1 of 15 frames shown (9 of 10)]
[frame 8/15]
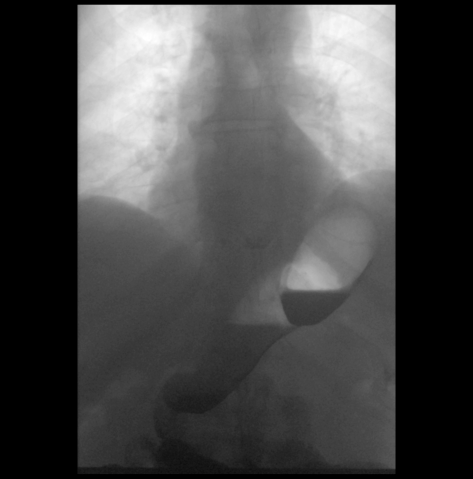

[Series 20: cp_standard · 0.28mm/px · 1 of 1 slices shown (10 of 10)]
[im 1/1]
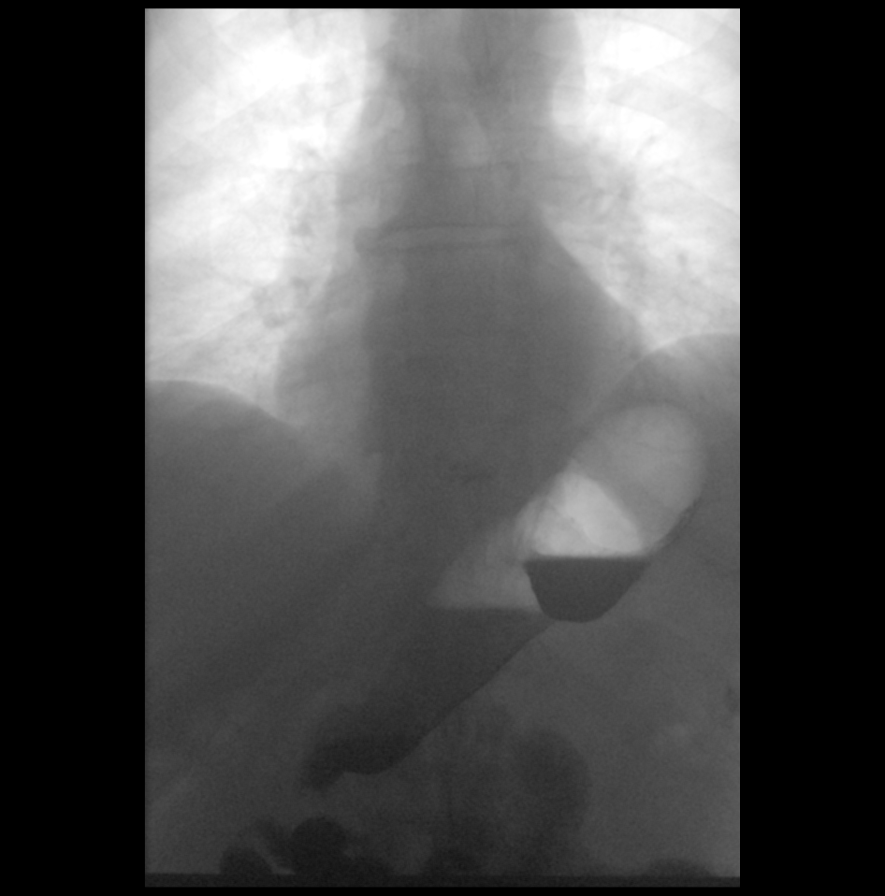

[13 of 24 positions shown; findings below may reference images not displayed]

FINDINGS: No laryngeal penetration or aspiration.

Poor primary esophageal stripping wave. Dilated esophagus to level
of the distal thoracic esophagus where there is smooth narrowing. 13
mm barium tablet does not traverse beyond this region internal it
was partially dissolved with warm water.

No worrisome mucosa abnormality noted.

No reflux demonstrated.
IMPRESSION: Findings consistent with achalasia as detailed above.

13 mm barium tablet does not traverse beyond distal esophageal
narrowing until it was partially dissolved with warm water. This was
discussed with the patient.
